# Patient Record
Sex: Male | Born: 2000
Health system: Southern US, Community
[De-identification: ages and names within clinical notes are randomized; demographics above are authoritative.]

---
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
## 2021-01-02 DIAGNOSIS — S83411D Sprain of medial collateral ligament of right knee, subsequent encounter: Secondary | ICD-10-CM | POA: Diagnosis not present

## 2021-06-03 DIAGNOSIS — Z0000 Encounter for general adult medical examination without abnormal findings: Secondary | ICD-10-CM | POA: Diagnosis not present

## 2021-06-18 DIAGNOSIS — Z0000 Encounter for general adult medical examination without abnormal findings: Secondary | ICD-10-CM | POA: Diagnosis not present

## 2021-06-18 DIAGNOSIS — Z1331 Encounter for screening for depression: Secondary | ICD-10-CM | POA: Diagnosis not present

## 2021-06-18 DIAGNOSIS — I493 Ventricular premature depolarization: Secondary | ICD-10-CM | POA: Diagnosis not present

## 2021-06-18 DIAGNOSIS — Z1339 Encounter for screening examination for other mental health and behavioral disorders: Secondary | ICD-10-CM | POA: Diagnosis not present

## 2021-09-03 DIAGNOSIS — M9905 Segmental and somatic dysfunction of pelvic region: Secondary | ICD-10-CM | POA: Diagnosis not present

## 2021-09-03 DIAGNOSIS — M546 Pain in thoracic spine: Secondary | ICD-10-CM | POA: Diagnosis not present

## 2021-09-03 DIAGNOSIS — M9902 Segmental and somatic dysfunction of thoracic region: Secondary | ICD-10-CM | POA: Diagnosis not present

## 2021-09-03 DIAGNOSIS — M9901 Segmental and somatic dysfunction of cervical region: Secondary | ICD-10-CM | POA: Diagnosis not present

## 2021-09-10 DIAGNOSIS — M9905 Segmental and somatic dysfunction of pelvic region: Secondary | ICD-10-CM | POA: Diagnosis not present

## 2021-09-10 DIAGNOSIS — M9902 Segmental and somatic dysfunction of thoracic region: Secondary | ICD-10-CM | POA: Diagnosis not present

## 2021-09-10 DIAGNOSIS — M9901 Segmental and somatic dysfunction of cervical region: Secondary | ICD-10-CM | POA: Diagnosis not present

## 2021-09-10 DIAGNOSIS — M546 Pain in thoracic spine: Secondary | ICD-10-CM | POA: Diagnosis not present

## 2021-09-15 DIAGNOSIS — M9905 Segmental and somatic dysfunction of pelvic region: Secondary | ICD-10-CM | POA: Diagnosis not present

## 2021-09-15 DIAGNOSIS — M9901 Segmental and somatic dysfunction of cervical region: Secondary | ICD-10-CM | POA: Diagnosis not present

## 2021-09-15 DIAGNOSIS — M546 Pain in thoracic spine: Secondary | ICD-10-CM | POA: Diagnosis not present

## 2021-09-15 DIAGNOSIS — M9902 Segmental and somatic dysfunction of thoracic region: Secondary | ICD-10-CM | POA: Diagnosis not present

## 2021-09-22 DIAGNOSIS — M9901 Segmental and somatic dysfunction of cervical region: Secondary | ICD-10-CM | POA: Diagnosis not present

## 2021-09-22 DIAGNOSIS — M546 Pain in thoracic spine: Secondary | ICD-10-CM | POA: Diagnosis not present

## 2021-09-22 DIAGNOSIS — M9905 Segmental and somatic dysfunction of pelvic region: Secondary | ICD-10-CM | POA: Diagnosis not present

## 2021-09-22 DIAGNOSIS — M9902 Segmental and somatic dysfunction of thoracic region: Secondary | ICD-10-CM | POA: Diagnosis not present

## 2021-09-29 DIAGNOSIS — M9905 Segmental and somatic dysfunction of pelvic region: Secondary | ICD-10-CM | POA: Diagnosis not present

## 2021-09-29 DIAGNOSIS — M9901 Segmental and somatic dysfunction of cervical region: Secondary | ICD-10-CM | POA: Diagnosis not present

## 2021-09-29 DIAGNOSIS — M9902 Segmental and somatic dysfunction of thoracic region: Secondary | ICD-10-CM | POA: Diagnosis not present

## 2021-09-29 DIAGNOSIS — M546 Pain in thoracic spine: Secondary | ICD-10-CM | POA: Diagnosis not present

## 2021-10-20 DIAGNOSIS — M9902 Segmental and somatic dysfunction of thoracic region: Secondary | ICD-10-CM | POA: Diagnosis not present

## 2021-10-20 DIAGNOSIS — M546 Pain in thoracic spine: Secondary | ICD-10-CM | POA: Diagnosis not present

## 2021-10-20 DIAGNOSIS — M9905 Segmental and somatic dysfunction of pelvic region: Secondary | ICD-10-CM | POA: Diagnosis not present

## 2021-10-20 DIAGNOSIS — M9901 Segmental and somatic dysfunction of cervical region: Secondary | ICD-10-CM | POA: Diagnosis not present

## 2021-10-28 DIAGNOSIS — M9905 Segmental and somatic dysfunction of pelvic region: Secondary | ICD-10-CM | POA: Diagnosis not present

## 2021-10-28 DIAGNOSIS — M9901 Segmental and somatic dysfunction of cervical region: Secondary | ICD-10-CM | POA: Diagnosis not present

## 2021-10-28 DIAGNOSIS — M9902 Segmental and somatic dysfunction of thoracic region: Secondary | ICD-10-CM | POA: Diagnosis not present

## 2021-10-28 DIAGNOSIS — M546 Pain in thoracic spine: Secondary | ICD-10-CM | POA: Diagnosis not present

## 2021-11-04 DIAGNOSIS — M9902 Segmental and somatic dysfunction of thoracic region: Secondary | ICD-10-CM | POA: Diagnosis not present

## 2021-11-04 DIAGNOSIS — M9901 Segmental and somatic dysfunction of cervical region: Secondary | ICD-10-CM | POA: Diagnosis not present

## 2021-11-04 DIAGNOSIS — M9905 Segmental and somatic dysfunction of pelvic region: Secondary | ICD-10-CM | POA: Diagnosis not present

## 2021-11-04 DIAGNOSIS — M546 Pain in thoracic spine: Secondary | ICD-10-CM | POA: Diagnosis not present

## 2021-11-11 DIAGNOSIS — M546 Pain in thoracic spine: Secondary | ICD-10-CM | POA: Diagnosis not present

## 2021-11-11 DIAGNOSIS — M9901 Segmental and somatic dysfunction of cervical region: Secondary | ICD-10-CM | POA: Diagnosis not present

## 2021-11-11 DIAGNOSIS — M9902 Segmental and somatic dysfunction of thoracic region: Secondary | ICD-10-CM | POA: Diagnosis not present

## 2021-11-11 DIAGNOSIS — M9905 Segmental and somatic dysfunction of pelvic region: Secondary | ICD-10-CM | POA: Diagnosis not present

## 2021-11-18 DIAGNOSIS — M9905 Segmental and somatic dysfunction of pelvic region: Secondary | ICD-10-CM | POA: Diagnosis not present

## 2021-11-18 DIAGNOSIS — M9901 Segmental and somatic dysfunction of cervical region: Secondary | ICD-10-CM | POA: Diagnosis not present

## 2021-11-18 DIAGNOSIS — M9902 Segmental and somatic dysfunction of thoracic region: Secondary | ICD-10-CM | POA: Diagnosis not present

## 2021-11-18 DIAGNOSIS — M546 Pain in thoracic spine: Secondary | ICD-10-CM | POA: Diagnosis not present

## 2021-11-24 DIAGNOSIS — M9902 Segmental and somatic dysfunction of thoracic region: Secondary | ICD-10-CM | POA: Diagnosis not present

## 2021-11-24 DIAGNOSIS — M9901 Segmental and somatic dysfunction of cervical region: Secondary | ICD-10-CM | POA: Diagnosis not present

## 2021-11-24 DIAGNOSIS — M9905 Segmental and somatic dysfunction of pelvic region: Secondary | ICD-10-CM | POA: Diagnosis not present

## 2021-11-24 DIAGNOSIS — M546 Pain in thoracic spine: Secondary | ICD-10-CM | POA: Diagnosis not present

## 2021-12-01 DIAGNOSIS — M9902 Segmental and somatic dysfunction of thoracic region: Secondary | ICD-10-CM | POA: Diagnosis not present

## 2021-12-01 DIAGNOSIS — M9901 Segmental and somatic dysfunction of cervical region: Secondary | ICD-10-CM | POA: Diagnosis not present

## 2021-12-01 DIAGNOSIS — M546 Pain in thoracic spine: Secondary | ICD-10-CM | POA: Diagnosis not present

## 2021-12-01 DIAGNOSIS — M9905 Segmental and somatic dysfunction of pelvic region: Secondary | ICD-10-CM | POA: Diagnosis not present

## 2021-12-08 DIAGNOSIS — M9905 Segmental and somatic dysfunction of pelvic region: Secondary | ICD-10-CM | POA: Diagnosis not present

## 2021-12-08 DIAGNOSIS — M546 Pain in thoracic spine: Secondary | ICD-10-CM | POA: Diagnosis not present

## 2021-12-08 DIAGNOSIS — M9902 Segmental and somatic dysfunction of thoracic region: Secondary | ICD-10-CM | POA: Diagnosis not present

## 2021-12-08 DIAGNOSIS — M9901 Segmental and somatic dysfunction of cervical region: Secondary | ICD-10-CM | POA: Diagnosis not present

## 2021-12-15 DIAGNOSIS — M9902 Segmental and somatic dysfunction of thoracic region: Secondary | ICD-10-CM | POA: Diagnosis not present

## 2021-12-15 DIAGNOSIS — M9905 Segmental and somatic dysfunction of pelvic region: Secondary | ICD-10-CM | POA: Diagnosis not present

## 2021-12-15 DIAGNOSIS — M9901 Segmental and somatic dysfunction of cervical region: Secondary | ICD-10-CM | POA: Diagnosis not present

## 2021-12-15 DIAGNOSIS — M546 Pain in thoracic spine: Secondary | ICD-10-CM | POA: Diagnosis not present

## 2022-01-07 DIAGNOSIS — M546 Pain in thoracic spine: Secondary | ICD-10-CM | POA: Diagnosis not present

## 2022-01-07 DIAGNOSIS — M9902 Segmental and somatic dysfunction of thoracic region: Secondary | ICD-10-CM | POA: Diagnosis not present

## 2022-01-07 DIAGNOSIS — M9905 Segmental and somatic dysfunction of pelvic region: Secondary | ICD-10-CM | POA: Diagnosis not present

## 2022-01-07 DIAGNOSIS — M9901 Segmental and somatic dysfunction of cervical region: Secondary | ICD-10-CM | POA: Diagnosis not present

## 2022-01-15 DIAGNOSIS — M9901 Segmental and somatic dysfunction of cervical region: Secondary | ICD-10-CM | POA: Diagnosis not present

## 2022-01-15 DIAGNOSIS — M546 Pain in thoracic spine: Secondary | ICD-10-CM | POA: Diagnosis not present

## 2022-01-15 DIAGNOSIS — M9905 Segmental and somatic dysfunction of pelvic region: Secondary | ICD-10-CM | POA: Diagnosis not present

## 2022-01-15 DIAGNOSIS — M9902 Segmental and somatic dysfunction of thoracic region: Secondary | ICD-10-CM | POA: Diagnosis not present

## 2022-01-25 DIAGNOSIS — M9905 Segmental and somatic dysfunction of pelvic region: Secondary | ICD-10-CM | POA: Diagnosis not present

## 2022-01-25 DIAGNOSIS — M546 Pain in thoracic spine: Secondary | ICD-10-CM | POA: Diagnosis not present

## 2022-01-25 DIAGNOSIS — M9902 Segmental and somatic dysfunction of thoracic region: Secondary | ICD-10-CM | POA: Diagnosis not present

## 2022-01-25 DIAGNOSIS — M9901 Segmental and somatic dysfunction of cervical region: Secondary | ICD-10-CM | POA: Diagnosis not present

## 2022-02-02 DIAGNOSIS — M9905 Segmental and somatic dysfunction of pelvic region: Secondary | ICD-10-CM | POA: Diagnosis not present

## 2022-02-02 DIAGNOSIS — M9902 Segmental and somatic dysfunction of thoracic region: Secondary | ICD-10-CM | POA: Diagnosis not present

## 2022-02-02 DIAGNOSIS — M546 Pain in thoracic spine: Secondary | ICD-10-CM | POA: Diagnosis not present

## 2022-02-02 DIAGNOSIS — M9901 Segmental and somatic dysfunction of cervical region: Secondary | ICD-10-CM | POA: Diagnosis not present

## 2022-02-16 DIAGNOSIS — M9905 Segmental and somatic dysfunction of pelvic region: Secondary | ICD-10-CM | POA: Diagnosis not present

## 2022-02-16 DIAGNOSIS — M9901 Segmental and somatic dysfunction of cervical region: Secondary | ICD-10-CM | POA: Diagnosis not present

## 2022-02-16 DIAGNOSIS — M546 Pain in thoracic spine: Secondary | ICD-10-CM | POA: Diagnosis not present

## 2022-02-16 DIAGNOSIS — M9902 Segmental and somatic dysfunction of thoracic region: Secondary | ICD-10-CM | POA: Diagnosis not present

## 2022-03-02 DIAGNOSIS — M9902 Segmental and somatic dysfunction of thoracic region: Secondary | ICD-10-CM | POA: Diagnosis not present

## 2022-03-02 DIAGNOSIS — M9905 Segmental and somatic dysfunction of pelvic region: Secondary | ICD-10-CM | POA: Diagnosis not present

## 2022-03-02 DIAGNOSIS — M546 Pain in thoracic spine: Secondary | ICD-10-CM | POA: Diagnosis not present

## 2022-03-02 DIAGNOSIS — M9901 Segmental and somatic dysfunction of cervical region: Secondary | ICD-10-CM | POA: Diagnosis not present

## 2022-03-23 DIAGNOSIS — M9905 Segmental and somatic dysfunction of pelvic region: Secondary | ICD-10-CM | POA: Diagnosis not present

## 2022-03-23 DIAGNOSIS — M9902 Segmental and somatic dysfunction of thoracic region: Secondary | ICD-10-CM | POA: Diagnosis not present

## 2022-03-23 DIAGNOSIS — M546 Pain in thoracic spine: Secondary | ICD-10-CM | POA: Diagnosis not present

## 2022-03-23 DIAGNOSIS — M9901 Segmental and somatic dysfunction of cervical region: Secondary | ICD-10-CM | POA: Diagnosis not present

## 2022-03-30 DIAGNOSIS — M9902 Segmental and somatic dysfunction of thoracic region: Secondary | ICD-10-CM | POA: Diagnosis not present

## 2022-03-30 DIAGNOSIS — M9901 Segmental and somatic dysfunction of cervical region: Secondary | ICD-10-CM | POA: Diagnosis not present

## 2022-03-30 DIAGNOSIS — M9905 Segmental and somatic dysfunction of pelvic region: Secondary | ICD-10-CM | POA: Diagnosis not present

## 2022-03-30 DIAGNOSIS — M546 Pain in thoracic spine: Secondary | ICD-10-CM | POA: Diagnosis not present

## 2022-04-06 DIAGNOSIS — M9901 Segmental and somatic dysfunction of cervical region: Secondary | ICD-10-CM | POA: Diagnosis not present

## 2022-04-06 DIAGNOSIS — M546 Pain in thoracic spine: Secondary | ICD-10-CM | POA: Diagnosis not present

## 2022-04-06 DIAGNOSIS — M9905 Segmental and somatic dysfunction of pelvic region: Secondary | ICD-10-CM | POA: Diagnosis not present

## 2022-04-06 DIAGNOSIS — M9902 Segmental and somatic dysfunction of thoracic region: Secondary | ICD-10-CM | POA: Diagnosis not present

## 2022-04-13 DIAGNOSIS — M9905 Segmental and somatic dysfunction of pelvic region: Secondary | ICD-10-CM | POA: Diagnosis not present

## 2022-04-13 DIAGNOSIS — M546 Pain in thoracic spine: Secondary | ICD-10-CM | POA: Diagnosis not present

## 2022-04-13 DIAGNOSIS — M9901 Segmental and somatic dysfunction of cervical region: Secondary | ICD-10-CM | POA: Diagnosis not present

## 2022-04-13 DIAGNOSIS — M9902 Segmental and somatic dysfunction of thoracic region: Secondary | ICD-10-CM | POA: Diagnosis not present

## 2022-04-20 DIAGNOSIS — M546 Pain in thoracic spine: Secondary | ICD-10-CM | POA: Diagnosis not present

## 2022-04-20 DIAGNOSIS — M9901 Segmental and somatic dysfunction of cervical region: Secondary | ICD-10-CM | POA: Diagnosis not present

## 2022-04-20 DIAGNOSIS — M9905 Segmental and somatic dysfunction of pelvic region: Secondary | ICD-10-CM | POA: Diagnosis not present

## 2022-04-20 DIAGNOSIS — M9902 Segmental and somatic dysfunction of thoracic region: Secondary | ICD-10-CM | POA: Diagnosis not present

## 2022-04-27 DIAGNOSIS — M546 Pain in thoracic spine: Secondary | ICD-10-CM | POA: Diagnosis not present

## 2022-04-27 DIAGNOSIS — M9901 Segmental and somatic dysfunction of cervical region: Secondary | ICD-10-CM | POA: Diagnosis not present

## 2022-04-27 DIAGNOSIS — M9902 Segmental and somatic dysfunction of thoracic region: Secondary | ICD-10-CM | POA: Diagnosis not present

## 2022-04-27 DIAGNOSIS — M9905 Segmental and somatic dysfunction of pelvic region: Secondary | ICD-10-CM | POA: Diagnosis not present

## 2022-06-16 DIAGNOSIS — I493 Ventricular premature depolarization: Secondary | ICD-10-CM | POA: Diagnosis not present

## 2022-06-16 DIAGNOSIS — Z0000 Encounter for general adult medical examination without abnormal findings: Secondary | ICD-10-CM | POA: Diagnosis not present

## 2022-06-21 ENCOUNTER — Ambulatory Visit: Payer: BC Managed Care – PPO | Admitting: Orthopedic Surgery

## 2022-06-21 ENCOUNTER — Other Ambulatory Visit (INDEPENDENT_AMBULATORY_CARE_PROVIDER_SITE_OTHER): Payer: BC Managed Care – PPO

## 2022-06-21 ENCOUNTER — Encounter: Payer: Self-pay | Admitting: Orthopedic Surgery

## 2022-06-21 DIAGNOSIS — M25551 Pain in right hip: Secondary | ICD-10-CM

## 2022-06-21 NOTE — Progress Notes (Signed)
Office Visit Note   Patient: Jeremy Wyatt           Date of Birth: [birth year]           MRN: [id number] Visit Date: 06/21/2022 Requested by: Charlane Ferretti, DO [address] [place],  [place] [address] PCP: Charlane Ferretti, DO  Subjective: Chief Complaint  Patient presents with   Right Hip - Pain    HPI: Jeremy Wyatt is a 22 y.o. male who presents to the office reporting right hip pain of several months duration.  Been going on at least 5 months.  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  He was last seen in 2019.  Hip MRI at that time was negative for any pathology including the labrum and joint.  Jeremy Wyatt reports that he only has pain after working out.  He reports decreased mobility in the right hip.  This is really been symptomatic for about 4 months.  The hip also gives out at times.  He is not limping.  He feels the symptoms "inside the hip".  Not taking too much in terms of medication.  He is fine walking around but it really hurts him when he is working out and he is working out several times a day in order to prepare for the season.  He is able to run on the hip but when he is doing squats and other activities which require abduction of the hip it becomes more symptomatic..                ROS: All systems reviewed are negative as they relate to the chief complaint within the history of present illness.  Patient denies fevers or chills.  Assessment & Plan: Visit Diagnoses:  1. 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  Plan is MRI arthrogram to evaluate the labrum as well as the extent of the bony deformity on the superior aspect of the femoral neck.  Follow-up after that study.  Lang may have to  red shirt if operative treatment is indicated.  Follow-Up Instructions: No follow-ups on file.   Orders:  Orders Placed This  Encounter  Procedures   XR HIP UNILAT W OR W/O PELVIS 2-3 VIEWS RIGHT   MR Hip Right w/o contrast   No orders of the defined types were placed in this encounter.     Procedures: No procedures performed   Clinical Data: No additional findings.  Objective: Vital Signs: There were no vitals taken for this visit.  Physical Exam:  Constitutional: Patient appears well-developed HEENT:  Head: Normocephalic Eyes:EOM are normal Neck: Normal range of motion Cardiovascular: Normal rate Pulmonary/chest: Effort normal Neurologic: Patient is alert Skin: Skin is warm Psychiatric: Patient has normal mood and affect  Ortho Exam: Ortho exam demonstrates extremely fit appearing college athlete with normal gait and alignment.  No muscle atrophy in the legs.  Has excellent hip abduction adduction and flexion strength.  Mild groin pain with internal/external rotation of that right leg particularly with max abduction.  Not too much in terms of popping in the hip with passive range of motion.  Does report pain with squatting with the leg in the AB ducted position.  Specialty Comments:  No specialty comments available.  Imaging: XR HIP UNILAT W OR W/O PELVIS 2-3 VIEWS RIGHT  Result Date: 06/21/2022 AP pelvis  lateral right hip radiographs reviewed.  No acute fracture.  Femoral acetabular impingement is noted with cam type impingement on the lateral femoral neck superiorly.  No significant joint space narrowing in either hip.    PMFS History: There are no problems to display for this patient.  History reviewed. No pertinent past medical history.  History reviewed. No pertinent family history.  History reviewed. No pertinent surgical history. Social History   Occupational History   Not on file  Tobacco Use   Smoking status: Never   Smokeless tobacco: Never  Substance and Sexual Activity   Alcohol use: No   Drug use: No   Sexual activity: Not on file

## 2022-06-24 DIAGNOSIS — Z0000 Encounter for general adult medical examination without abnormal findings: Secondary | ICD-10-CM | POA: Diagnosis not present

## 2022-06-24 DIAGNOSIS — M9903 Segmental and somatic dysfunction of lumbar region: Secondary | ICD-10-CM | POA: Diagnosis not present

## 2022-06-24 DIAGNOSIS — Z1331 Encounter for screening for depression: Secondary | ICD-10-CM | POA: Diagnosis not present

## 2022-06-24 DIAGNOSIS — M25551 Pain in right hip: Secondary | ICD-10-CM | POA: Diagnosis not present

## 2022-07-07 ENCOUNTER — Ambulatory Visit
Admission: RE | Admit: 2022-07-07 | Discharge: 2022-07-07 | Disposition: A | Discharge status: Home / Self Care | Payer: BC Managed Care – PPO | Source: Ambulatory Visit | Attending: Orthopedic Surgery | Admitting: Orthopedic Surgery

## 2022-07-07 DIAGNOSIS — M25551 Pain in right hip: Secondary | ICD-10-CM | POA: Diagnosis not present

## 2022-07-07 DIAGNOSIS — R936 Abnormal findings on diagnostic imaging of limbs: Secondary | ICD-10-CM | POA: Diagnosis not present

## 2022-07-12 ENCOUNTER — Telehealth: Payer: Self-pay

## 2022-07-12 DIAGNOSIS — M25551 Pain in right hip: Secondary | ICD-10-CM

## 2022-07-12 NOTE — Progress Notes (Signed)
Hi Lauren.  I talked with Gaynell Face over the weekend.  Can you get him set up to see Dr. Donnal Debar for right hip arthroscopy.  Somewhat urgent.  [facility].  Thanks

## 2022-07-12 NOTE — Telephone Encounter (Signed)
-----   Message from Cammy Copa, MD sent at 07/12/2022 12:50 PM EDT ----- Jeremy Wyatt.  I talked with Gaynell Face over the weekend.  Can you get him set up to see Dr. Donnal Debar for right hip arthroscopy.  Somewhat urgent.  [facility].  Thanks

## 2022-07-13 NOTE — Addendum Note (Signed)
Addended by: Barbette Or on: 07/13/2022 08:17 AM   Modules accepted: Orders

## 2022-07-13 NOTE — Telephone Encounter (Signed)
Urgent referral placed in chart

## 2022-07-14 ENCOUNTER — Ambulatory Visit: Payer: BC Managed Care – PPO | Admitting: Orthopedic Surgery

## 2022-07-19 ENCOUNTER — Telehealth: Payer: Self-pay

## 2022-07-19 ENCOUNTER — Other Ambulatory Visit: Payer: Self-pay

## 2022-07-19 DIAGNOSIS — M25551 Pain in right hip: Secondary | ICD-10-CM

## 2022-07-19 NOTE — Telephone Encounter (Signed)
Referral has been refaxed to [phone]

## 2022-07-19 NOTE — Telephone Encounter (Signed)
Left voicemail advising CD was ready for pickup at front desk.

## 2022-07-19 NOTE — Telephone Encounter (Signed)
Dr August Saucer has referred patient to Dr Caswell Corwin. Parents needing 2 disc with images from here. They will call imaging facility to get images of MRI. Please call patients mom when ready for pick up.    Also Martie Lee, please resubmit everything to Dr Caswell Corwin office as his mother is stating they still do not have information regarding referral, I did fax on Friday. Would you please resubmit it to [phone] as he has an appt scheduled for 08/06.  Also per their request I have put in a referral for Dr Lenox Ahr and this should be faxed to [phone]

## 2022-07-27 DIAGNOSIS — M25552 Pain in left hip: Secondary | ICD-10-CM | POA: Diagnosis not present

## 2022-07-27 DIAGNOSIS — M76892 Other specified enthesopathies of left lower limb, excluding foot: Secondary | ICD-10-CM | POA: Diagnosis not present

## 2022-07-27 DIAGNOSIS — M76891 Other specified enthesopathies of right lower limb, excluding foot: Secondary | ICD-10-CM | POA: Diagnosis not present

## 2022-07-27 DIAGNOSIS — M25551 Pain in right hip: Secondary | ICD-10-CM | POA: Diagnosis not present

## 2022-08-03 DIAGNOSIS — M79604 Pain in right leg: Secondary | ICD-10-CM | POA: Diagnosis not present

## 2022-08-03 DIAGNOSIS — S73191A Other sprain of right hip, initial encounter: Secondary | ICD-10-CM | POA: Diagnosis not present

## 2022-08-05 DIAGNOSIS — S73192D Other sprain of left hip, subsequent encounter: Secondary | ICD-10-CM | POA: Diagnosis not present

## 2022-08-05 DIAGNOSIS — S73191D Other sprain of right hip, subsequent encounter: Secondary | ICD-10-CM | POA: Diagnosis not present

## 2022-08-23 DIAGNOSIS — M25852 Other specified joint disorders, left hip: Secondary | ICD-10-CM | POA: Diagnosis not present

## 2022-08-23 DIAGNOSIS — M25851 Other specified joint disorders, right hip: Secondary | ICD-10-CM | POA: Diagnosis not present

## 2022-08-24 DIAGNOSIS — M25551 Pain in right hip: Secondary | ICD-10-CM | POA: Diagnosis not present

## 2022-08-24 DIAGNOSIS — S73191D Other sprain of right hip, subsequent encounter: Secondary | ICD-10-CM | POA: Diagnosis not present

## 2022-08-24 DIAGNOSIS — M25859 Other specified joint disorders, unspecified hip: Secondary | ICD-10-CM | POA: Diagnosis not present

## 2022-08-24 DIAGNOSIS — S73192D Other sprain of left hip, subsequent encounter: Secondary | ICD-10-CM | POA: Diagnosis not present

## 2022-08-24 DIAGNOSIS — M25552 Pain in left hip: Secondary | ICD-10-CM | POA: Diagnosis not present

## 2022-08-30 DIAGNOSIS — M24851 Other specific joint derangements of right hip, not elsewhere classified: Secondary | ICD-10-CM | POA: Diagnosis not present

## 2022-08-30 DIAGNOSIS — M958 Other specified acquired deformities of musculoskeletal system: Secondary | ICD-10-CM | POA: Diagnosis not present

## 2022-08-30 DIAGNOSIS — S73191A Other sprain of right hip, initial encounter: Secondary | ICD-10-CM | POA: Diagnosis not present

## 2022-08-30 DIAGNOSIS — S73191D Other sprain of right hip, subsequent encounter: Secondary | ICD-10-CM | POA: Diagnosis not present

## 2022-08-30 DIAGNOSIS — M25851 Other specified joint disorders, right hip: Secondary | ICD-10-CM | POA: Diagnosis not present

## 2022-09-16 DIAGNOSIS — Z9889 Other specified postprocedural states: Secondary | ICD-10-CM | POA: Diagnosis not present

## 2022-09-22 DIAGNOSIS — Z9889 Other specified postprocedural states: Secondary | ICD-10-CM | POA: Diagnosis not present

## 2022-10-01 DIAGNOSIS — Z9889 Other specified postprocedural states: Secondary | ICD-10-CM | POA: Diagnosis not present

## 2022-10-19 DIAGNOSIS — Z9889 Other specified postprocedural states: Secondary | ICD-10-CM | POA: Diagnosis not present

## 2022-10-29 DIAGNOSIS — Z9889 Other specified postprocedural states: Secondary | ICD-10-CM | POA: Diagnosis not present

## 2022-11-03 DIAGNOSIS — Z9889 Other specified postprocedural states: Secondary | ICD-10-CM | POA: Diagnosis not present

## 2022-11-10 DIAGNOSIS — Z9889 Other specified postprocedural states: Secondary | ICD-10-CM | POA: Diagnosis not present

## 2022-11-18 DIAGNOSIS — M79604 Pain in right leg: Secondary | ICD-10-CM | POA: Diagnosis not present

## 2022-11-18 DIAGNOSIS — Z9889 Other specified postprocedural states: Secondary | ICD-10-CM | POA: Diagnosis not present

## 2022-11-18 DIAGNOSIS — S73192D Other sprain of left hip, subsequent encounter: Secondary | ICD-10-CM | POA: Diagnosis not present

## 2022-11-18 DIAGNOSIS — M79605 Pain in left leg: Secondary | ICD-10-CM | POA: Diagnosis not present

## 2022-11-24 DIAGNOSIS — M2410 Other articular cartilage disorders, unspecified site: Secondary | ICD-10-CM | POA: Diagnosis not present

## 2022-11-24 DIAGNOSIS — M25812 Other specified joint disorders, left shoulder: Secondary | ICD-10-CM | POA: Diagnosis not present

## 2022-11-24 DIAGNOSIS — M24852 Other specific joint derangements of left hip, not elsewhere classified: Secondary | ICD-10-CM | POA: Diagnosis not present

## 2022-11-24 DIAGNOSIS — M25859 Other specified joint disorders, unspecified hip: Secondary | ICD-10-CM | POA: Diagnosis not present

## 2022-11-24 DIAGNOSIS — S73192D Other sprain of left hip, subsequent encounter: Secondary | ICD-10-CM | POA: Diagnosis not present

## 2022-11-24 DIAGNOSIS — M94252 Chondromalacia, left hip: Secondary | ICD-10-CM | POA: Diagnosis not present

## 2022-11-24 DIAGNOSIS — M25852 Other specified joint disorders, left hip: Secondary | ICD-10-CM | POA: Diagnosis not present

## 2022-11-24 DIAGNOSIS — S73192A Other sprain of left hip, initial encounter: Secondary | ICD-10-CM | POA: Diagnosis not present

## 2022-11-24 DIAGNOSIS — M7072 Other bursitis of hip, left hip: Secondary | ICD-10-CM | POA: Diagnosis not present

## 2022-12-09 DIAGNOSIS — Z9889 Other specified postprocedural states: Secondary | ICD-10-CM | POA: Diagnosis not present

## 2022-12-09 DIAGNOSIS — M79605 Pain in left leg: Secondary | ICD-10-CM | POA: Diagnosis not present

## 2022-12-16 DIAGNOSIS — S73192D Other sprain of left hip, subsequent encounter: Secondary | ICD-10-CM | POA: Diagnosis not present

## 2022-12-20 DIAGNOSIS — S73192D Other sprain of left hip, subsequent encounter: Secondary | ICD-10-CM | POA: Diagnosis not present

## 2023-01-06 DIAGNOSIS — S73192D Other sprain of left hip, subsequent encounter: Secondary | ICD-10-CM | POA: Diagnosis not present

## 2023-01-13 DIAGNOSIS — S73192D Other sprain of left hip, subsequent encounter: Secondary | ICD-10-CM | POA: Diagnosis not present

## 2023-01-21 DIAGNOSIS — S73192D Other sprain of left hip, subsequent encounter: Secondary | ICD-10-CM | POA: Diagnosis not present

## 2023-01-24 DIAGNOSIS — S73192D Other sprain of left hip, subsequent encounter: Secondary | ICD-10-CM | POA: Diagnosis not present

## 2023-02-03 DIAGNOSIS — Z9889 Other specified postprocedural states: Secondary | ICD-10-CM | POA: Diagnosis not present

## 2023-02-09 DIAGNOSIS — Z9889 Other specified postprocedural states: Secondary | ICD-10-CM | POA: Diagnosis not present

## 2023-02-09 DIAGNOSIS — S73192D Other sprain of left hip, subsequent encounter: Secondary | ICD-10-CM | POA: Diagnosis not present

## 2023-02-17 DIAGNOSIS — S73192D Other sprain of left hip, subsequent encounter: Secondary | ICD-10-CM | POA: Diagnosis not present

## 2023-03-01 DIAGNOSIS — S73192D Other sprain of left hip, subsequent encounter: Secondary | ICD-10-CM | POA: Diagnosis not present

## 2023-03-08 DIAGNOSIS — S73192D Other sprain of left hip, subsequent encounter: Secondary | ICD-10-CM | POA: Diagnosis not present

## 2023-03-10 DIAGNOSIS — Z9889 Other specified postprocedural states: Secondary | ICD-10-CM | POA: Diagnosis not present

## 2023-03-10 DIAGNOSIS — M79605 Pain in left leg: Secondary | ICD-10-CM | POA: Diagnosis not present

## 2023-03-18 DIAGNOSIS — S73192D Other sprain of left hip, subsequent encounter: Secondary | ICD-10-CM | POA: Diagnosis not present

## 2023-03-25 DIAGNOSIS — S73192D Other sprain of left hip, subsequent encounter: Secondary | ICD-10-CM | POA: Diagnosis not present

## 2023-04-01 DIAGNOSIS — S73192D Other sprain of left hip, subsequent encounter: Secondary | ICD-10-CM | POA: Diagnosis not present

## 2023-04-28 DIAGNOSIS — Z9889 Other specified postprocedural states: Secondary | ICD-10-CM | POA: Diagnosis not present

## 2023-05-13 DIAGNOSIS — M25552 Pain in left hip: Secondary | ICD-10-CM | POA: Diagnosis not present

## 2023-05-13 DIAGNOSIS — M25551 Pain in right hip: Secondary | ICD-10-CM | POA: Diagnosis not present

## 2023-05-13 DIAGNOSIS — Z9889 Other specified postprocedural states: Secondary | ICD-10-CM | POA: Diagnosis not present

## 2023-05-13 DIAGNOSIS — M79604 Pain in right leg: Secondary | ICD-10-CM | POA: Diagnosis not present

## 2023-07-19 DIAGNOSIS — Z9889 Other specified postprocedural states: Secondary | ICD-10-CM | POA: Diagnosis not present

## 2023-07-21 DIAGNOSIS — Z1339 Encounter for screening examination for other mental health and behavioral disorders: Secondary | ICD-10-CM | POA: Diagnosis not present

## 2023-07-21 DIAGNOSIS — Z0000 Encounter for general adult medical examination without abnormal findings: Secondary | ICD-10-CM | POA: Diagnosis not present

## 2023-07-21 DIAGNOSIS — R350 Frequency of micturition: Secondary | ICD-10-CM | POA: Diagnosis not present

## 2023-07-21 DIAGNOSIS — R5383 Other fatigue: Secondary | ICD-10-CM | POA: Diagnosis not present

## 2023-07-21 DIAGNOSIS — R748 Abnormal levels of other serum enzymes: Secondary | ICD-10-CM | POA: Diagnosis not present

## 2023-07-21 DIAGNOSIS — Z1331 Encounter for screening for depression: Secondary | ICD-10-CM | POA: Diagnosis not present

## 2023-07-25 DIAGNOSIS — N3281 Overactive bladder: Secondary | ICD-10-CM | POA: Diagnosis not present

## 2023-07-27 DIAGNOSIS — R351 Nocturia: Secondary | ICD-10-CM | POA: Diagnosis not present

## 2023-07-27 DIAGNOSIS — R350 Frequency of micturition: Secondary | ICD-10-CM | POA: Diagnosis not present

## 2023-08-01 DIAGNOSIS — R0683 Snoring: Secondary | ICD-10-CM | POA: Diagnosis not present

## 2023-08-01 DIAGNOSIS — R5383 Other fatigue: Secondary | ICD-10-CM | POA: Diagnosis not present

## 2023-08-01 DIAGNOSIS — G4719 Other hypersomnia: Secondary | ICD-10-CM | POA: Diagnosis not present

## 2023-08-01 DIAGNOSIS — Z7689 Persons encountering health services in other specified circumstances: Secondary | ICD-10-CM | POA: Diagnosis not present

## 2023-08-03 DIAGNOSIS — R3915 Urgency of urination: Secondary | ICD-10-CM | POA: Diagnosis not present

## 2023-08-03 DIAGNOSIS — R351 Nocturia: Secondary | ICD-10-CM | POA: Diagnosis not present

## 2023-08-03 DIAGNOSIS — R350 Frequency of micturition: Secondary | ICD-10-CM | POA: Diagnosis not present

## 2023-12-07 DIAGNOSIS — M79604 Pain in right leg: Secondary | ICD-10-CM | POA: Diagnosis not present

## 2023-12-07 DIAGNOSIS — M25551 Pain in right hip: Secondary | ICD-10-CM | POA: Diagnosis not present

## 2023-12-07 DIAGNOSIS — M899 Disorder of bone, unspecified: Secondary | ICD-10-CM | POA: Diagnosis not present

## 2023-12-08 DIAGNOSIS — M25551 Pain in right hip: Secondary | ICD-10-CM | POA: Diagnosis not present

## 2023-12-08 DIAGNOSIS — M899 Disorder of bone, unspecified: Secondary | ICD-10-CM | POA: Diagnosis not present

## 2023-12-14 DIAGNOSIS — M898X8 Other specified disorders of bone, other site: Secondary | ICD-10-CM | POA: Diagnosis not present

## 2023-12-14 DIAGNOSIS — M25551 Pain in right hip: Secondary | ICD-10-CM | POA: Diagnosis not present

## 2023-12-14 DIAGNOSIS — M25552 Pain in left hip: Secondary | ICD-10-CM | POA: Diagnosis not present
